# Patient Record
Sex: Female | Born: 1955 | Hispanic: Yes
Health system: Southern US, Community
[De-identification: ages and names within clinical notes are randomized; demographics above are authoritative.]

## PROBLEM LIST (undated history)

## (undated) HISTORY — PX: CARDIAC CATHETERIZATION: SHX172

---

## 2017-04-13 DIAGNOSIS — I4891 Unspecified atrial fibrillation: Secondary | ICD-10-CM

## 2017-04-13 DIAGNOSIS — R079 Chest pain, unspecified: Secondary | ICD-10-CM

## 2017-04-14 DIAGNOSIS — R079 Chest pain, unspecified: Secondary | ICD-10-CM

## 2018-04-05 DIAGNOSIS — Z01818 Encounter for other preprocedural examination: Secondary | ICD-10-CM

## 2019-07-03 ENCOUNTER — Emergency Department (HOSPITAL_COMMUNITY): Payer: No Typology Code available for payment source

## 2019-07-03 ENCOUNTER — Emergency Department (HOSPITAL_COMMUNITY)
Admission: EM | Admit: 2019-07-03 | Discharge: 2019-07-04 | Disposition: A | Discharge status: Home / Self Care | Payer: No Typology Code available for payment source | Attending: Emergency Medicine | Admitting: Emergency Medicine

## 2019-07-03 ENCOUNTER — Encounter (HOSPITAL_COMMUNITY): Payer: Self-pay

## 2019-07-03 DIAGNOSIS — M25511 Pain in right shoulder: Secondary | ICD-10-CM | POA: Insufficient documentation

## 2019-07-03 DIAGNOSIS — R0789 Other chest pain: Secondary | ICD-10-CM | POA: Diagnosis present

## 2019-07-03 LAB — BASIC METABOLIC PANEL
Anion gap: 11 (ref 5–15)
BUN: 14 mg/dL (ref 8–23)
CO2: 28 mmol/L (ref 22–32)
Calcium: 9.2 mg/dL (ref 8.9–10.3)
Chloride: 102 mmol/L (ref 98–111)
Creatinine, Ser: 0.67 mg/dL (ref 0.44–1.00)
GFR calc Af Amer: >60 mL/min (ref >60)
GFR calc non Af Amer: >60 mL/min (ref >60)
Glucose, Bld: 118 mg/dL — ABNORMAL HIGH (ref 70–99)
Potassium: 3.9 mmol/L (ref 3.5–5.1)
Sodium: 141 mmol/L (ref 135–145)

## 2019-07-03 LAB — CBC
HCT: 39.5 % (ref 36.0–46.0)
Hemoglobin: 12.5 g/dL (ref 12.0–15.0)
MCH: 28.5 pg (ref 26.0–34.0)
MCHC: 31.6 g/dL (ref 30.0–36.0)
MCV: 90 fL (ref 80.0–100.0)
Platelets: 212 10*3/uL (ref 150–400)
RBC: 4.39 MIL/uL (ref 3.87–5.11)
RDW: 13.6 % (ref 11.5–15.5)
WBC: 5 10*3/uL (ref 4.0–10.5)
nRBC: 0 % (ref 0.0–0.2)

## 2019-07-03 LAB — TROPONIN I (HIGH SENSITIVITY): Troponin I (High Sensitivity): 4 ng/L (ref <18)

## 2019-07-03 MED ORDER — SODIUM CHLORIDE 0.9% FLUSH: 3.0000 mL | Freq: Once | INTRAVENOUS | Status: DC
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
## 2019-07-04 ENCOUNTER — Emergency Department (HOSPITAL_COMMUNITY): Payer: No Typology Code available for payment source

## 2019-07-04 LAB — TROPONIN I (HIGH SENSITIVITY): Troponin I (High Sensitivity): 5 ng/L (ref <18)

## 2019-07-04 NOTE — Discharge Instructions (Signed)
Take 4 over the counter ibuprofen tablets 3 times a day or 2 over-the-counter naproxen tablets twice a day for pain. Also take tylenol 1000mg(2 extra strength) four times a day.    
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
## 2019-07-04 NOTE — ED Notes (Addendum)
Discharge instructions discussed with pt using interpretor. Pt verbalized understanding. Pt stable and ambulatory. No signature pad available

## 2019-08-14 DIAGNOSIS — Z01818 Encounter for other preprocedural examination: Secondary | ICD-10-CM

## 2019-12-27 DIAGNOSIS — I6389 Other cerebral infarction: Secondary | ICD-10-CM
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
